# Patient Record
Sex: Male | Born: 1985 | Race: Black or African American | Hispanic: No | Marital: Single | Smoking: Current every day smoker
Health system: Southern US, Community
[De-identification: ages and names within clinical notes are randomized; demographics above are authoritative.]

## PROBLEM LIST (undated history)

## (undated) DIAGNOSIS — Z9109 Other allergy status, other than to drugs and biological substances: Secondary | ICD-10-CM

## (undated) DIAGNOSIS — J45909 Unspecified asthma, uncomplicated: Secondary | ICD-10-CM

---

## 2007-11-28 ENCOUNTER — Emergency Department: Payer: Self-pay | Admitting: Emergency Medicine

## 2007-11-28 ENCOUNTER — Other Ambulatory Visit: Payer: Self-pay

## 2009-04-29 IMAGING — CT CT CERVICAL SPINE WITHOUT CONTRAST
1 series · 12 of 14 positions shown, 15 images · non-contrast
Comparison: none

REASON FOR EXAM: neck pain
COMMENTS:

[Series 5: axial · axial · 0.30mm/px · z∈[+417,+578]mm · 12 of 97 slices shown, 15 images]
[im 8/97  soft-tissue]
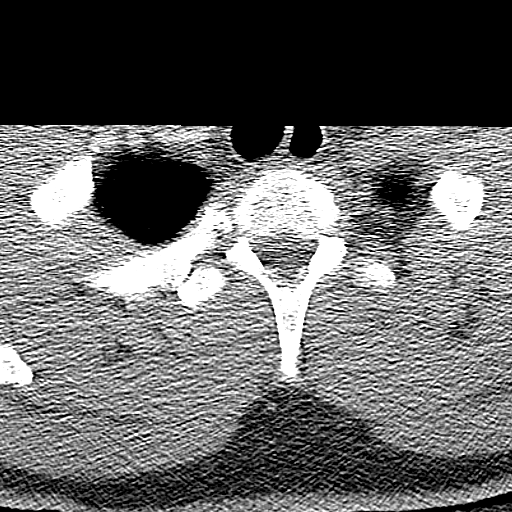
[im 8/97  bone]
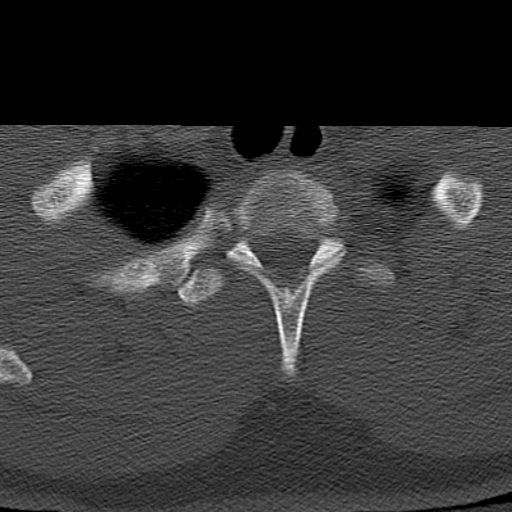
[im 15/97  bone]
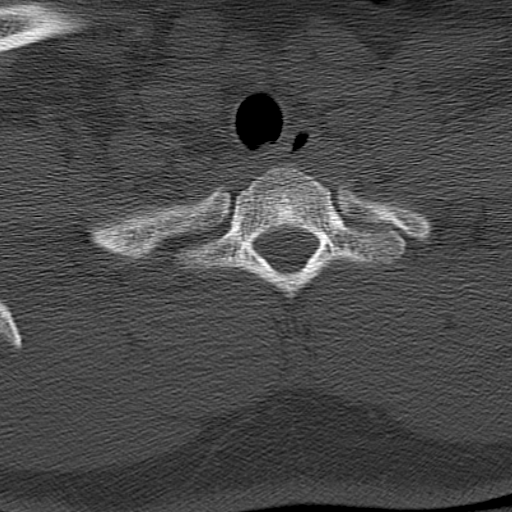
[im 23/97  bone]
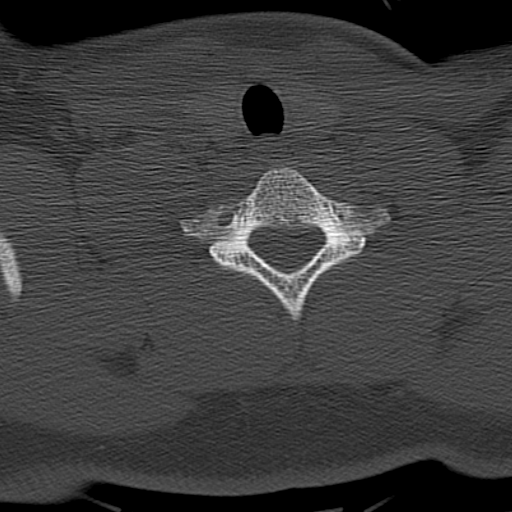
[im 30/97  bone]
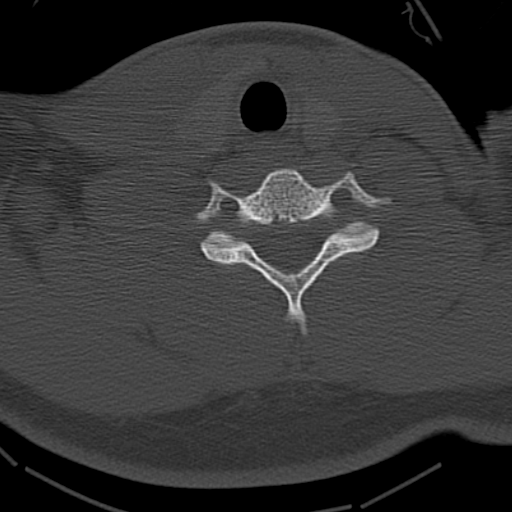
[im 37/97  soft-tissue]
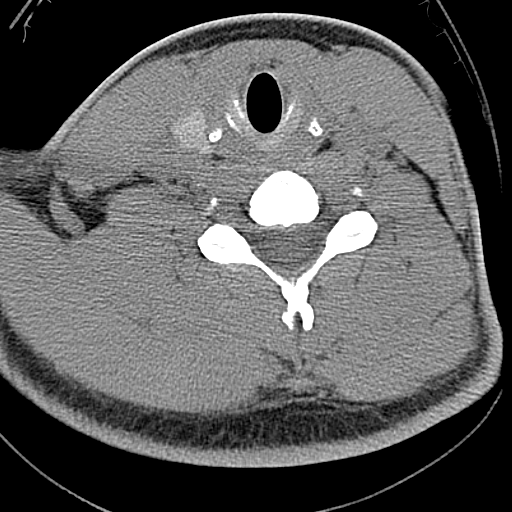
[im 37/97  bone]
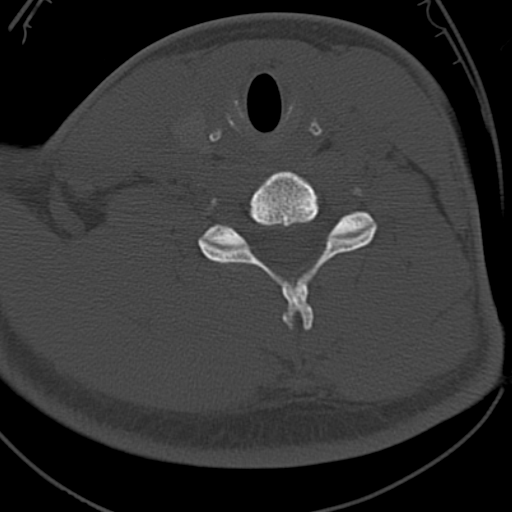
[im 45/97  bone]
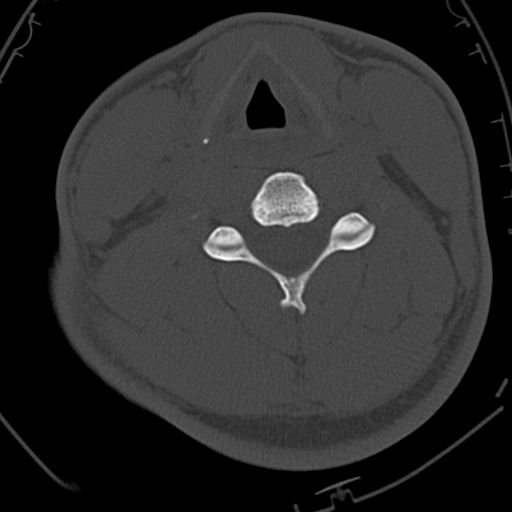
[im 52/97  bone]
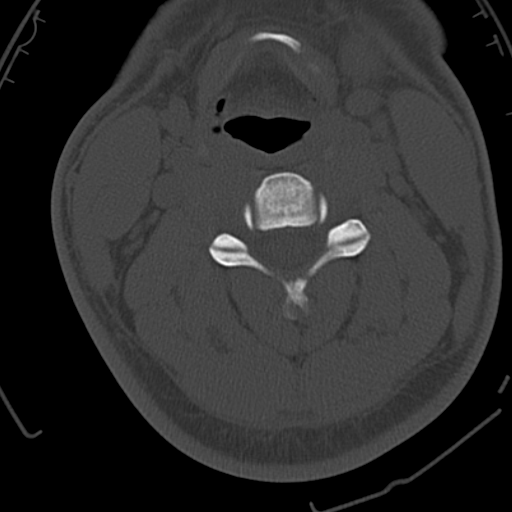
[im 60/97  bone]
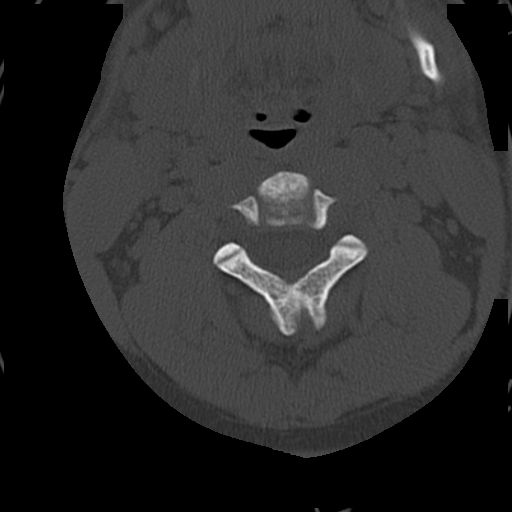
[im 67/97  soft-tissue]
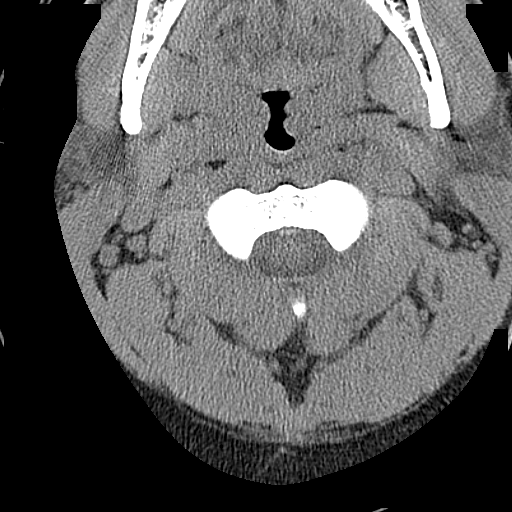
[im 67/97  bone]
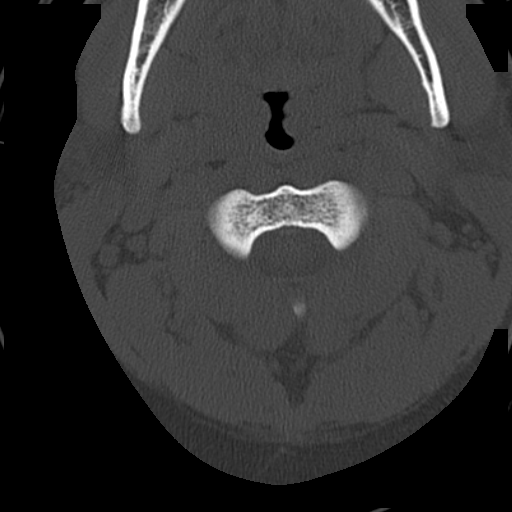
[im 74/97  bone]
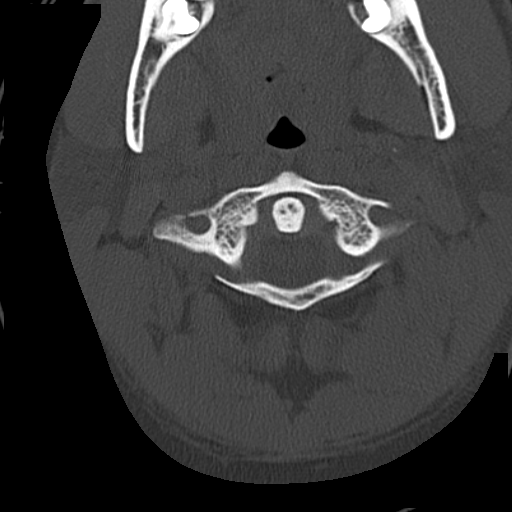
[im 82/97  bone]
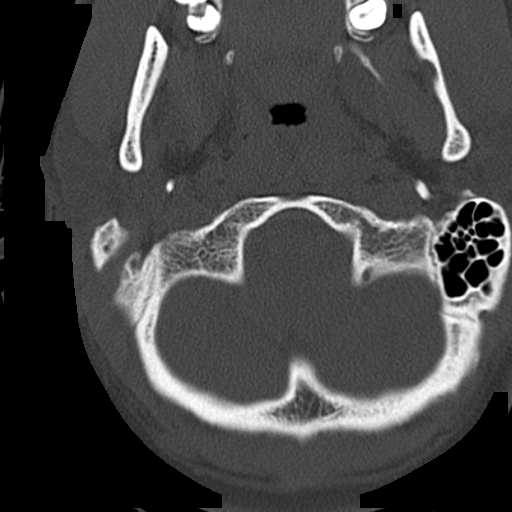
[im 89/97  bone]
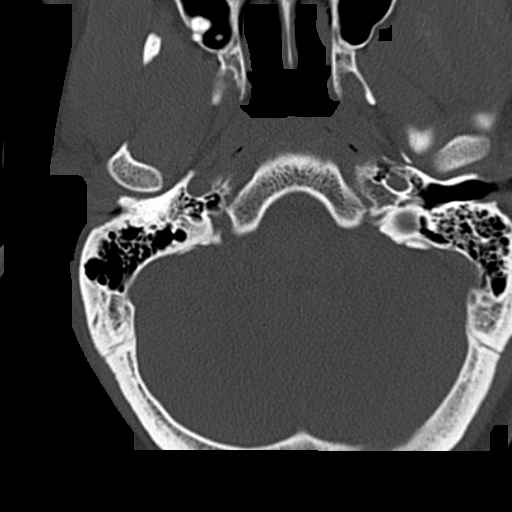

[12 of 14 positions shown; findings below may reference images not displayed]

PROCEDURE:     CT  - CT CERVICAL SPINE WO  - November 28, 2007  [DATE]

RESULT:     Multiplanar imaging of the cervical spine was obtained utilizing
a high definition bone algorithm.

There is no evidence of fracture, dislocation or malalignment. There is no
evidence of prevertebral soft tissue swelling. Scattered reactive lymph
nodes are identified within the neck.
IMPRESSION: 1.No evidence of acute osseous abnormalities.

2.Scattered reactive lymph nodes as described above.

Dr. Rahimbaks of the Emergency Room was informed of these findings via a
preliminary faxed report on 11/28/07 at [DATE] a.m., Central Time.

## 2013-02-13 ENCOUNTER — Emergency Department (HOSPITAL_COMMUNITY): Payer: BC Managed Care – PPO

## 2013-02-13 ENCOUNTER — Encounter (HOSPITAL_COMMUNITY): Payer: Self-pay | Admitting: Emergency Medicine

## 2013-02-13 ENCOUNTER — Emergency Department (HOSPITAL_COMMUNITY)
Admission: EM | Admit: 2013-02-13 | Discharge: 2013-02-13 | Disposition: A | Discharge status: Home / Self Care | Payer: BC Managed Care – PPO | Attending: Emergency Medicine | Admitting: Emergency Medicine

## 2013-02-13 DIAGNOSIS — J40 Bronchitis, not specified as acute or chronic: Secondary | ICD-10-CM | POA: Insufficient documentation

## 2013-02-13 DIAGNOSIS — R5381 Other malaise: Secondary | ICD-10-CM | POA: Insufficient documentation

## 2013-02-13 DIAGNOSIS — J3489 Other specified disorders of nose and nasal sinuses: Secondary | ICD-10-CM | POA: Insufficient documentation

## 2013-02-13 MED ORDER — IPRATROPIUM BROMIDE 0.02 % IN SOLN
0.5000 mg | Freq: Once | RESPIRATORY_TRACT | Status: AC
  Administered 2013-02-13: 0.5 mg via RESPIRATORY_TRACT
  Filled 2013-02-13: qty 2.5

## 2013-02-13 MED ORDER — ALBUTEROL SULFATE (5 MG/ML) 0.5% IN NEBU
5.0000 mg | INHALATION_SOLUTION | Freq: Once | RESPIRATORY_TRACT | Status: AC
  Administered 2013-02-13: 5 mg via RESPIRATORY_TRACT
  Filled 2013-02-13: qty 1

## 2013-02-13 MED ORDER — ALBUTEROL SULFATE HFA 108 (90 BASE) MCG/ACT IN AERS
2.0000 | INHALATION_SPRAY | RESPIRATORY_TRACT | Status: DC | PRN
  Administered 2013-02-13: 2 via RESPIRATORY_TRACT
  Filled 2013-02-13: qty 6.7

## 2013-02-13 MED ORDER — BENZONATATE 100 MG PO CAPS: 100.0000 mg | ORAL_CAPSULE | Freq: Three times a day (TID) | ORAL | Status: DC

## 2013-02-13 NOTE — ED Notes (Signed)
Cough for the past few days now, intermit green phlem when clearing his throat. No distress noted.

## 2013-02-13 NOTE — ED Provider Notes (Signed)
Medical screening examination/treatment/procedure(s) were performed by non-physician practitioner and as supervising physician I was immediately available for consultation/collaboration.  Priti Consoli F Skyy Nilan, MD 02/13/13 1755 

## 2013-02-13 NOTE — ED Provider Notes (Signed)
CSN: [id number]     Arrival date & time 02/13/13  1350 History  This chart was scribed for non-physician practitioner working with Shon Baton, MD by Ashley Jacobs, ED scribe. This patient was seen in room WTR9/WTR9 and the patient's care was started at 2:16 PM   First MD Initiated Contact with Patient 02/13/13 1407     Chief Complaint  Patient presents with  . Cough   (Consider location/radiation/quality/duration/timing/severity/associated sxs/prior Treatment) Patient is a 27 y.o. male presenting with cough. The history is provided by the patient and medical records. No language interpreter was used.  Cough Cough characteristics:  Productive Sputum characteristics:  Green Severity:  Moderate Onset quality:  Sudden Duration:  2 days Timing:  Constant Progression:  Unchanged Chronicity:  New Relieved by:  Nothing Worsened by:  Deep breathing Ineffective treatments:  None tried Associated symptoms: rhinorrhea and wheezing   Associated symptoms: no chest pain, no chills, no ear pain, no fever, no headaches, no myalgias, no rash and no sore throat    HPI Comments: Nakhi Choi is a 27 y.o. male who presents to the Emergency Department complaining of constant, productive cough for the past two days. He feels as though he was in a Forensic [employer] and lost a round". He explains that he is experiencing the associated symptoms of fatigue, wheezing, and an expiratory cough with green phlegm. He also mentions having mild congestion. Pt denies fever and headache. He has a hx of seasonal asthma.   History reviewed. No pertinent past medical history. History reviewed. No pertinent past surgical history. No family history on file. History  Substance Use Topics  . Smoking status: Not on file  . Smokeless tobacco: Not on file  . Alcohol Use: Not on file    Review of Systems  Constitutional: Positive for fatigue. Negative for fever and chills.  HENT: Positive for congestion and  rhinorrhea. Negative for ear pain, sore throat, neck stiffness and sinus pressure.   Eyes: Negative for redness.  Respiratory: Positive for cough and wheezing. Negative for chest tightness.   Cardiovascular: Negative for chest pain.  Gastrointestinal: Negative for nausea, vomiting, abdominal pain and diarrhea.  Genitourinary: Negative for dysuria.  Musculoskeletal: Negative for myalgias.  Skin: Negative for rash.  Neurological: Negative for headaches.  Hematological: Negative for adenopathy.  All other systems reviewed and are negative.    Allergies  Review of patient's allergies indicates no known allergies.  Home Medications   Current Outpatient Rx  Name  Route  Sig  Dispense  Refill  . benzonatate (TESSALON) 100 MG capsule   Oral   Take 1 capsule (100 mg total) by mouth every 8 (eight) hours.   15 capsule   0    BP 116/66  Pulse 66  Resp 18  SpO2 100% Physical Exam  Nursing note and vitals reviewed. Constitutional: He appears well-developed and well-nourished.  HENT:  Head: Normocephalic and atraumatic.  Right Ear: Tympanic membrane, external ear and ear canal normal.  Left Ear: Tympanic membrane, external ear and ear canal normal.  Nose: Nose normal. No mucosal edema or rhinorrhea.  Mouth/Throat: Uvula is midline, oropharynx is clear and moist and mucous membranes are normal. Mucous membranes are not dry. No trismus in the jaw. No edematous. No oropharyngeal exudate, posterior oropharyngeal edema, posterior oropharyngeal erythema or tonsillar abscesses.  Eyes: Conjunctivae are normal. Right eye exhibits no discharge. Left eye exhibits no discharge.  Neck: Normal range of motion. Neck supple.  Cardiovascular: Normal rate, regular rhythm and  normal heart sounds.   Pulmonary/Chest: Effort normal. No respiratory distress. He has wheezes (Mild, expiratory). He has no rales.  Abdominal: Soft. There is no tenderness.  Lymphadenopathy:    He has no cervical adenopathy.   Neurological: He is alert.  Skin: Skin is warm and dry.  Psychiatric: He has a normal mood and affect.    ED Course  Procedures (including critical care time) DIAGNOSTIC STUDIES: Oxygen Saturation is 100% on room air, normal by my interpretation.    COORDINATION OF CARE: 2:18 PM Discussed course of care with pt which includes Proventil and Atrovent. Pt understands and agrees.  Labs Review Labs Reviewed - No data to display Imaging Review Dg Chest 2 View  02/13/2013   CLINICAL DATA:  Cough and shortness of breath.  EXAM: CHEST  2 VIEW  COMPARISON:  None.  FINDINGS: The heart size and mediastinal contours are within normal limits. Both lungs are clear. The visualized skeletal structures are unremarkable.  IMPRESSION: Normal chest radiographs.   Electronically Signed   By: Amie Portland M.D.   On: 02/13/2013 14:46   Vital signs reviewed and are as follows: Filed Vitals:   02/13/13 1453  BP: 116/66  Pulse:   Resp:   BP 116/66  Pulse 66  Resp 18  SpO2 100%  Patient given breathing treatment in emergency department. On recheck, wheezing resolved. Patient informed of chest x-ray results.  Patient counseled on use of albuterol HFA. Told to use 1-2 puffs q 4 hours as needed for SOB.   MDM   1. Bronchitis    Patient with likely viral bronchitis. No concern for PNA given normal lung exam/x-ray. Antibiotics not indicated. Conservative therapy indicated.     I personally performed the services described in this documentation, which was scribed in my presence. The recorded information has been reviewed and is accurate.   Renne Crigler, PA-C 02/13/13 [phone]

## 2013-02-13 NOTE — Progress Notes (Signed)
[facility] provided pt with a list of primary care resources. Patient stated that he may have insurance through his father, but was unsure. Provided him with paperwork to send insurance information back to the hospital.

## 2013-08-09 ENCOUNTER — Emergency Department (HOSPITAL_COMMUNITY)
Admission: EM | Admit: 2013-08-09 | Discharge: 2013-08-09 | Disposition: A | Discharge status: Home / Self Care | Payer: BC Managed Care – PPO | Attending: Emergency Medicine | Admitting: Emergency Medicine

## 2013-08-09 ENCOUNTER — Encounter (HOSPITAL_COMMUNITY): Payer: Self-pay | Admitting: Emergency Medicine

## 2013-08-09 DIAGNOSIS — F17200 Nicotine dependence, unspecified, uncomplicated: Secondary | ICD-10-CM | POA: Insufficient documentation

## 2013-08-09 DIAGNOSIS — L509 Urticaria, unspecified: Secondary | ICD-10-CM

## 2013-08-09 DIAGNOSIS — J45909 Unspecified asthma, uncomplicated: Secondary | ICD-10-CM | POA: Insufficient documentation

## 2013-08-09 DIAGNOSIS — H5789 Other specified disorders of eye and adnexa: Secondary | ICD-10-CM | POA: Insufficient documentation

## 2013-08-09 DIAGNOSIS — R6889 Other general symptoms and signs: Secondary | ICD-10-CM | POA: Insufficient documentation

## 2013-08-09 HISTORY — DX: Unspecified asthma, uncomplicated: J45.909

## 2013-08-09 MED ORDER — DIPHENHYDRAMINE HCL 25 MG PO TABS: 25.0000 mg | ORAL_TABLET | Freq: Four times a day (QID) | ORAL | Status: AC | PRN

## 2013-08-09 MED ORDER — DIPHENHYDRAMINE HCL 25 MG PO CAPS
50.0000 mg | ORAL_CAPSULE | Freq: Once | ORAL | Status: AC
  Administered 2013-08-09: 50 mg via ORAL
  Filled 2013-08-09: qty 2

## 2013-08-09 NOTE — Progress Notes (Signed)
[facility] provided pt with a list of primary care resources to help patient establish primary care.  °

## 2013-08-09 NOTE — Discharge Instructions (Signed)
Allergies °Allergies may happen from anything your body is sensitive to. This may be food, medicines, pollens, chemicals, and nearly anything around you in everyday life that produces allergens. An allergen is anything that causes an allergy producing substance. Heredity is often a factor in causing these problems. This means you may have some of the same allergies as your parents. °Food allergies happen in all age groups. Food allergies are some of the most severe and life threatening. Some common food allergies are cow's milk, seafood, eggs, nuts, wheat, and soybeans. °SYMPTOMS  °· Swelling around the mouth. °· An itchy red rash or hives. °· Vomiting or diarrhea. °· Difficulty breathing. °SEVERE ALLERGIC REACTIONS ARE LIFE-THREATENING. °This reaction is called anaphylaxis. It can cause the mouth and throat to swell and cause difficulty with breathing and swallowing. In severe reactions only a trace amount of food (for example, peanut oil in a salad) may cause death within seconds. °Seasonal allergies occur in all age groups. These are seasonal because they usually occur during the same season every year. They may be a reaction to molds, grass pollens, or tree pollens. Other causes of problems are house dust mite allergens, pet dander, and mold spores. The symptoms often consist of nasal congestion, a runny itchy nose associated with sneezing, and tearing itchy eyes. There is often an associated itching of the mouth and ears. The problems happen when you come in contact with pollens and other allergens. Allergens are the particles in the air that the body reacts to with an allergic reaction. This causes you to release allergic antibodies. Through a chain of events, these eventually cause you to release histamine into the blood stream. Although it is meant to be protective to the body, it is this release that causes your discomfort. This is why you were given anti-histamines to feel better.  If you are unable to  pinpoint the offending allergen, it may be determined by skin or blood testing. Allergies cannot be cured but can be controlled with medicine. °Hay fever is a collection of all or some of the seasonal allergy problems. It may often be treated with simple over-the-counter medicine such as diphenhydramine. Take medicine as directed. Do not drink alcohol or drive while taking this medicine. Check with your caregiver or package insert for child dosages. °If these medicines are not effective, there are many new medicines your caregiver can prescribe. Stronger medicine such as nasal spray, eye drops, and corticosteroids may be used if the first things you try do not work well. Other treatments such as immunotherapy or desensitizing injections can be used if all else fails. Follow up with your caregiver if problems continue. These seasonal allergies are usually not life threatening. They are generally more of a nuisance that can often be handled using medicine. °HOME CARE INSTRUCTIONS  °· If unsure what causes a reaction, keep a diary of foods eaten and symptoms that follow. Avoid foods that cause reactions. °· If hives or rash are present: °· Take medicine as directed. °· You may use an over-the-counter antihistamine (diphenhydramine) for hives and itching as needed. °· Apply cold compresses (cloths) to the skin or take baths in cool water. Avoid hot baths or showers. Heat will make a rash and itching worse. °· If you are severely allergic: °· Following a treatment for a severe reaction, hospitalization is often required for closer follow-up. °· Wear a medic-alert bracelet or necklace stating the allergy. °· You and your family must learn how to give adrenaline or use   an anaphylaxis kit. °· If you have had a severe reaction, always carry your anaphylaxis kit or EpiPen® with you. Use this medicine as directed by your caregiver if a severe reaction is occurring. Failure to do so could have a fatal outcome. °SEEK MEDICAL  CARE IF: °· You suspect a food allergy. Symptoms generally happen within 30 minutes of eating a food. °· Your symptoms have not gone away within 2 days or are getting worse. °· You develop new symptoms. °· You want to retest yourself or your child with a food or drink you think causes an allergic reaction. Never do this if an anaphylactic reaction to that food or drink has happened before. Only do this under the care of a caregiver. °SEEK IMMEDIATE MEDICAL CARE IF:  °· You have difficulty breathing, are wheezing, or have a tight feeling in your chest or throat. °· You have a swollen mouth, or you have hives, swelling, or itching all over your body. °· You have had a severe reaction that has responded to your anaphylaxis kit or an EpiPen®. These reactions may return when the medicine has worn off. These reactions should be considered life threatening. °MAKE SURE YOU:  °· Understand these instructions. °· Will watch your condition. °· Will get help right away if you are not doing well or get worse. °Document Released: 07/20/2002 Document Revised: 08/21/2012 Document Reviewed: 12/25/2007 °[employer]® Patient Information ©2014 [employer]. ° °

## 2013-08-09 NOTE — ED Notes (Addendum)
Pt reports intermittent hives, itching, area around eyes feels swollen. Sx unresolved by Claritin. Denies shortness of breath or difficulty swallowing

## 2013-08-09 NOTE — ED Provider Notes (Signed)
CSN: [id number]     Arrival date & time 08/09/13  1143 History  This chart was scribed for non-physician practitioner Roxy Horseman, PA-C, working with Juliet Rude. Rubin Payor, MD, by Yevette Edwards, ED Scribe. This patient was seen in room WTR5/WTR5 and the patient's care was started at 12:22 PM. First MD Initiated Contact with Patient 08/09/13 1207     Chief Complaint  Patient presents with  . Urticaria    itching and swelling due to seasonal allergies    The history is provided by the patient. No language interpreter was used.   HPI Comments: Corey Burke is a 28 y.o. male, with a h/o asthma, presenting to the Emergency Department with the chief complaint of hives which ahve been occurring intermittently for thee days. He states itching and pain associated with the hives as well as sneezing and eye irritation, and he reports the hives are increased in the morning and night. Additionally, he states the hives are increased with scratching. The pt has used Claritin, eye drops, and calamine lotion to treat the hives; he began using Claritin when the symptoms presented. He had similar symptoms last year; however, he reports last symptom's were better controlled. He had chronic asthma as a child which has resolved; he was informed he currently he has seasonal asthma.   Past Medical History  Diagnosis Date  . Asthma    History reviewed. No pertinent past surgical history. History reviewed. No pertinent family history. History  Substance Use Topics  . Smoking status: Current Every Day Smoker    Types: Cigarettes  . Smokeless tobacco: Not on file  . Alcohol Use: Yes    Review of Systems  A complete 10 system review of systems was obtained, and all systems were negative except where indicated in the HPI and PE.    Allergies  Review of patient's allergies indicates no known allergies.  Home Medications   Current Outpatient Rx  Name  Route  Sig  Dispense  Refill  . benzonatate  (TESSALON) 100 MG capsule   Oral   Take 1 capsule (100 mg total) by mouth every 8 (eight) hours.   15 capsule   0    Triage Vitals: BP 124/85  Pulse 88  Temp(Src) 98 F (36.7 C) (Oral)  Resp 18  Wt 230 lb (104.327 kg)  SpO2 96%  Physical Exam  Nursing note and vitals reviewed. Constitutional: He is oriented to person, place, and time. He appears well-developed and well-nourished. No distress.  HENT:  Head: Normocephalic and atraumatic.  Eyes: EOM are normal.  Neck: Neck supple. No tracheal deviation present.  Cardiovascular: Normal rate.   Pulmonary/Chest: Effort normal. No respiratory distress. He has no wheezes.  Musculoskeletal: Normal range of motion.  Neurological: He is alert and oriented to person, place, and time.  Skin: Skin is warm and dry. No rash noted. No erythema.  No obvious rash to skin.   Psychiatric: He has a normal mood and affect. His behavior is normal.    ED Course  Procedures (including critical care time)  DIAGNOSTIC STUDIES: Oxygen Saturation is 96% on room air, normal by my interpretation.    COORDINATION OF CARE:  12:24 PM- Discussed treatment plan with patient, and the patient agreed to the plan. The plan includes continual usage of Claritin as well as Benadryl.   Labs Review Labs Reviewed - No data to display Imaging Review No results found.   EKG Interpretation None      MDM   Final  diagnoses:  Urticaria   Patient with seasonal allergies. No rash currently, no fever, no wheezing.   I personally performed the services described in this documentation, which was scribed in my presence. The recorded information has been reviewed and is accurate.       Roxy Horsemanobert Doralyn Kirkes, PA-C 08/09/13 1239

## 2013-08-11 NOTE — ED Provider Notes (Signed)
Medical screening examination/treatment/procedure(s) were performed by non-physician practitioner and as supervising physician I was immediately available for consultation/collaboration.   EKG Interpretation None       Juliet RudeNathan R. Rubin PayorPickering, MD 08/11/13 1513

## 2013-08-15 ENCOUNTER — Encounter (HOSPITAL_COMMUNITY): Payer: Self-pay | Admitting: Emergency Medicine

## 2013-08-15 ENCOUNTER — Emergency Department (HOSPITAL_COMMUNITY)
Admission: EM | Admit: 2013-08-15 | Discharge: 2013-08-15 | Disposition: A | Discharge status: Home / Self Care | Payer: BC Managed Care – PPO | Attending: Emergency Medicine | Admitting: Emergency Medicine

## 2013-08-15 DIAGNOSIS — T4995XA Adverse effect of unspecified topical agent, initial encounter: Secondary | ICD-10-CM | POA: Insufficient documentation

## 2013-08-15 DIAGNOSIS — F17200 Nicotine dependence, unspecified, uncomplicated: Secondary | ICD-10-CM | POA: Insufficient documentation

## 2013-08-15 DIAGNOSIS — T7840XA Allergy, unspecified, initial encounter: Secondary | ICD-10-CM

## 2013-08-15 DIAGNOSIS — J45901 Unspecified asthma with (acute) exacerbation: Secondary | ICD-10-CM | POA: Insufficient documentation

## 2013-08-15 DIAGNOSIS — R21 Rash and other nonspecific skin eruption: Secondary | ICD-10-CM | POA: Insufficient documentation

## 2013-08-15 DIAGNOSIS — L509 Urticaria, unspecified: Secondary | ICD-10-CM

## 2013-08-15 DIAGNOSIS — Z79899 Other long term (current) drug therapy: Secondary | ICD-10-CM | POA: Insufficient documentation

## 2013-08-15 DIAGNOSIS — L299 Pruritus, unspecified: Secondary | ICD-10-CM | POA: Insufficient documentation

## 2013-08-15 HISTORY — DX: Other allergy status, other than to drugs and biological substances: Z91.09

## 2013-08-15 MED ORDER — ALBUTEROL SULFATE HFA 108 (90 BASE) MCG/ACT IN AERS
2.0000 | INHALATION_SPRAY | Freq: Once | RESPIRATORY_TRACT | Status: AC
  Administered 2013-08-15: 2 via RESPIRATORY_TRACT
  Filled 2013-08-15: qty 6.7

## 2013-08-15 MED ORDER — PREDNISONE 20 MG PO TABS: 60.0000 mg | ORAL_TABLET | Freq: Every day | ORAL | Status: AC

## 2013-08-15 MED ORDER — PREDNISONE 20 MG PO TABS
60.0000 mg | ORAL_TABLET | Freq: Once | ORAL | Status: AC
  Administered 2013-08-15: 60 mg via ORAL
  Filled 2013-08-15: qty 3

## 2013-08-15 NOTE — ED Provider Notes (Signed)
CSN: [id number]     Arrival date & time 08/15/13  0804 History   First MD Initiated Contact with Patient 08/15/13 0813     Chief Complaint  Patient presents with  . Allergies     (Consider location/radiation/quality/duration/timing/severity/associated sxs/prior Treatment) HPI 28 year old male presents with hives for several days. He was seen here a few days ago and given Benadryl. He's been taking Benadryl 50 mg every 6 hours with minimal relief. States the hives but fluctuating in intensity. They're itchy and diffuse. He's also been having rhinorrhea and watery, itchy eyes. He's tried some over-the-counter eyedrops with some relief. He states he feels like his childhood asthma is also flaring. Last night he had trouble breathing with wheezing. Today it is not as bad. Denies any lip swelling, trouble speaking, drooling, or trouble swallowing. Denies a chest pain. He has not taken any new medicines or food. He states that as a kid he had severe allergies when he lived in the [place] and was diagnosed with severe pollen allergies by allergy skin testing. He currently lives in [place] but has been in [place] for last couple weeks. He is going back to [place] in about 5 days. He never has these symptoms when he is in [place]. He currently is down here for work, and works outside.  Past Medical History  Diagnosis Date  . Asthma   . Pollen allergies    History reviewed. No pertinent past surgical history. History reviewed. No pertinent family history. History  Substance Use Topics  . Smoking status: Current Every Day Smoker    Types: Cigarettes  . Smokeless tobacco: Not on file  . Alcohol Use: Yes    Review of Systems  Constitutional: Negative for fever.  HENT: Positive for congestion and rhinorrhea. Negative for sore throat, trouble swallowing and voice change.   Eyes: Positive for itching.  Respiratory: Positive for shortness of breath and wheezing.   Gastrointestinal: Negative  for vomiting.  Skin: Positive for rash.  All other systems reviewed and are negative.     Allergies  Review of patient's allergies indicates no known allergies.  Home Medications   Current Outpatient Rx  Name  Route  Sig  Dispense  Refill  . diphenhydrAMINE (BENADRYL) 25 MG tablet   Oral   Take 1 tablet (25 mg total) by mouth every 6 (six) hours as needed for itching (Rash).   30 tablet   0   . loratadine (CLARITIN) 10 MG tablet   Oral   Take 10 mg by mouth daily.          BP 129/86  Pulse 94  Temp(Src) 98.2 F (36.8 C) (Oral)  Resp 16  SpO2 98% Physical Exam  Nursing note and vitals reviewed. Constitutional: He is oriented to person, place, and time. He appears well-developed and well-nourished.  HENT:  Head: Normocephalic and atraumatic.  Right Ear: External ear normal.  Left Ear: External ear normal.  Nose: Nose normal.  Mouth/Throat: Uvula is midline, oropharynx is clear and moist and mucous membranes are normal. No uvula swelling. No oropharyngeal exudate, posterior oropharyngeal edema or posterior oropharyngeal erythema.  Eyes: Right eye exhibits no chemosis, no discharge and no exudate. Left eye exhibits no chemosis, no discharge and no exudate. Right conjunctiva is not injected. Right conjunctiva has no hemorrhage. Left conjunctiva is not injected. Left conjunctiva has no hemorrhage.  Neck: Neck supple.  Cardiovascular: Normal rate, regular rhythm, normal heart sounds and intact distal pulses.   Pulmonary/Chest:  Effort normal. No accessory muscle usage. Not tachypneic. He has no decreased breath sounds. He has wheezes (scant, intermittent expiratory wheezes).  Abdominal: Soft. There is no tenderness.  Musculoskeletal: He exhibits no edema.  Neurological: He is alert and oriented to person, place, and time.  Skin: Skin is warm and dry. Rash (diffuse hives in all 4 extremities, chest/abdomen and back) noted. Rash is urticarial.  No signs of pus or infection     ED Course  Procedures (including critical care time) Labs Review Labs Reviewed - No data to display Imaging Review No results found.   EKG Interpretation None      MDM   Final diagnoses:  Hives  Allergic reaction    Patient with scant wheezes now, no respiratory distress. No trouble swallowing, speaking, or signs of acute swelling to oropharynx. Has diffuse hives, as above. Will continue claritin and benadryl, will give burst of prednisone given his diffuse symptoms and albuterol prn bronchospasm. It seems his allergies are from pollen, no new foods or medicines that could be causing his hives. Discussed other OTC, symptomatic care. VSS here, will discharge home with return precautions. Recommended he try to work inside if possible, as avoidance is best strategy.    Audree CamelScott T Corvette Orser, MD 08/15/13 [phone]

## 2013-08-15 NOTE — ED Notes (Signed)
MD at bedside. 

## 2013-08-15 NOTE — ED Notes (Signed)
Pt took OTC meds as directed by last visit here with no relief

## 2013-08-15 NOTE — ED Notes (Signed)
Pt states that he is allergic to pollen.  Has been taking claritin and benadryl.  Has still been breaking out in hives.  States that he thinks his "asthma is coming back".  Able to speak in complete sentences.  Also c/o eye irritation.

## 2013-08-15 NOTE — Discharge Instructions (Signed)
Use the albuterol inhaler if you have breathing difficulty or wheezing. Use 2 puffs every four hours as needed. If your breathing or other symptoms worsen, return to this or any other ER for evaluation. The best way to control your allergy symptoms is avoidance of the allergen (pollen).  Allergies Allergies may happen from anything your body is sensitive to. This may be food, medicines, pollens, chemicals, and nearly anything around you in everyday life that produces allergens. An allergen is anything that causes an allergy producing substance. Heredity is often a factor in causing these problems. This means you may have some of the same allergies as your parents. Food allergies happen in all age groups. Food allergies are some of the most severe and life threatening. Some common food allergies are cow's milk, seafood, eggs, nuts, wheat, and soybeans. SYMPTOMS   Swelling around the mouth.  An itchy red rash or hives.  Vomiting or diarrhea.  Difficulty breathing. SEVERE ALLERGIC REACTIONS ARE LIFE-THREATENING. This reaction is called anaphylaxis. It can cause the mouth and throat to swell and cause difficulty with breathing and swallowing. In severe reactions only a trace amount of food (for example, peanut oil in a salad) may cause death within seconds. Seasonal allergies occur in all age groups. These are seasonal because they usually occur during the same season every year. They may be a reaction to molds, grass pollens, or tree pollens. Other causes of problems are house dust mite allergens, pet dander, and mold spores. The symptoms often consist of nasal congestion, a runny itchy nose associated with sneezing, and tearing itchy eyes. There is often an associated itching of the mouth and ears. The problems happen when you come in contact with pollens and other allergens. Allergens are the particles in the air that the body reacts to with an allergic reaction. This causes you to release allergic  antibodies. Through a chain of events, these eventually cause you to release histamine into the blood stream. Although it is meant to be protective to the body, it is this release that causes your discomfort. This is why you were given anti-histamines to feel better. If you are unable to pinpoint the offending allergen, it may be determined by skin or blood testing. Allergies cannot be cured but can be controlled with medicine. Hay fever is a collection of all or some of the seasonal allergy problems. It may often be treated with simple over-the-counter medicine such as diphenhydramine. Take medicine as directed. Do not drink alcohol or drive while taking this medicine. Check with your caregiver or package insert for child dosages. If these medicines are not effective, there are many new medicines your caregiver can prescribe. Stronger medicine such as nasal spray, eye drops, and corticosteroids may be used if the first things you try do not work well. Other treatments such as immunotherapy or desensitizing injections can be used if all else fails. Follow up with your caregiver if problems continue. These seasonal allergies are usually not life threatening. They are generally more of a nuisance that can often be handled using medicine. HOME CARE INSTRUCTIONS   If unsure what causes a reaction, keep a diary of foods eaten and symptoms that follow. Avoid foods that cause reactions.  If hives or rash are present:  Take medicine as directed.  You may use an over-the-counter antihistamine (diphenhydramine) for hives and itching as needed.  Apply cold compresses (cloths) to the skin or take baths in cool water. Avoid hot baths or showers. Heat will  make a rash and itching worse.  If you are severely allergic:  Following a treatment for a severe reaction, hospitalization is often required for closer follow-up.  Wear a medic-alert bracelet or necklace stating the allergy.  You and your family must  learn how to give adrenaline or use an anaphylaxis kit.  If you have had a severe reaction, always carry your anaphylaxis kit or EpiPen with you. Use this medicine as directed by your caregiver if a severe reaction is occurring. Failure to do so could have a fatal outcome. SEEK MEDICAL CARE IF:  You suspect a food allergy. Symptoms generally happen within 30 minutes of eating a food.  Your symptoms have not gone away within 2 days or are getting worse.  You develop new symptoms.  You want to retest yourself or your child with a food or drink you think causes an allergic reaction. Never do this if an anaphylactic reaction to that food or drink has happened before. Only do this under the care of a caregiver. SEEK IMMEDIATE MEDICAL CARE IF:   You have difficulty breathing, are wheezing, or have a tight feeling in your chest or throat.  You have a swollen mouth, or you have hives, swelling, or itching all over your body.  You have had a severe reaction that has responded to your anaphylaxis kit or an EpiPen. These reactions may return when the medicine has worn off. These reactions should be considered life threatening. MAKE SURE YOU:   Understand these instructions.  Will watch your condition.  Will get help right away if you are not doing well or get worse. Document Released: 07/20/2002 Document Revised: 08/21/2012 Document Reviewed: 12/25/2007 [facility] Patient Information 2014 [place].

## 2013-08-15 NOTE — Progress Notes (Signed)
Albuterol inhaler given with spacer. Pt knows and understands how to use.

## 2014-07-16 IMAGING — CR DG CHEST 2V
2 series · 2 of 2 positions shown · non-contrast
Comparison: None.

CLINICAL DATA: Cough and shortness of breath.

EXAM:
CHEST  2 VIEW

[w chest pa]
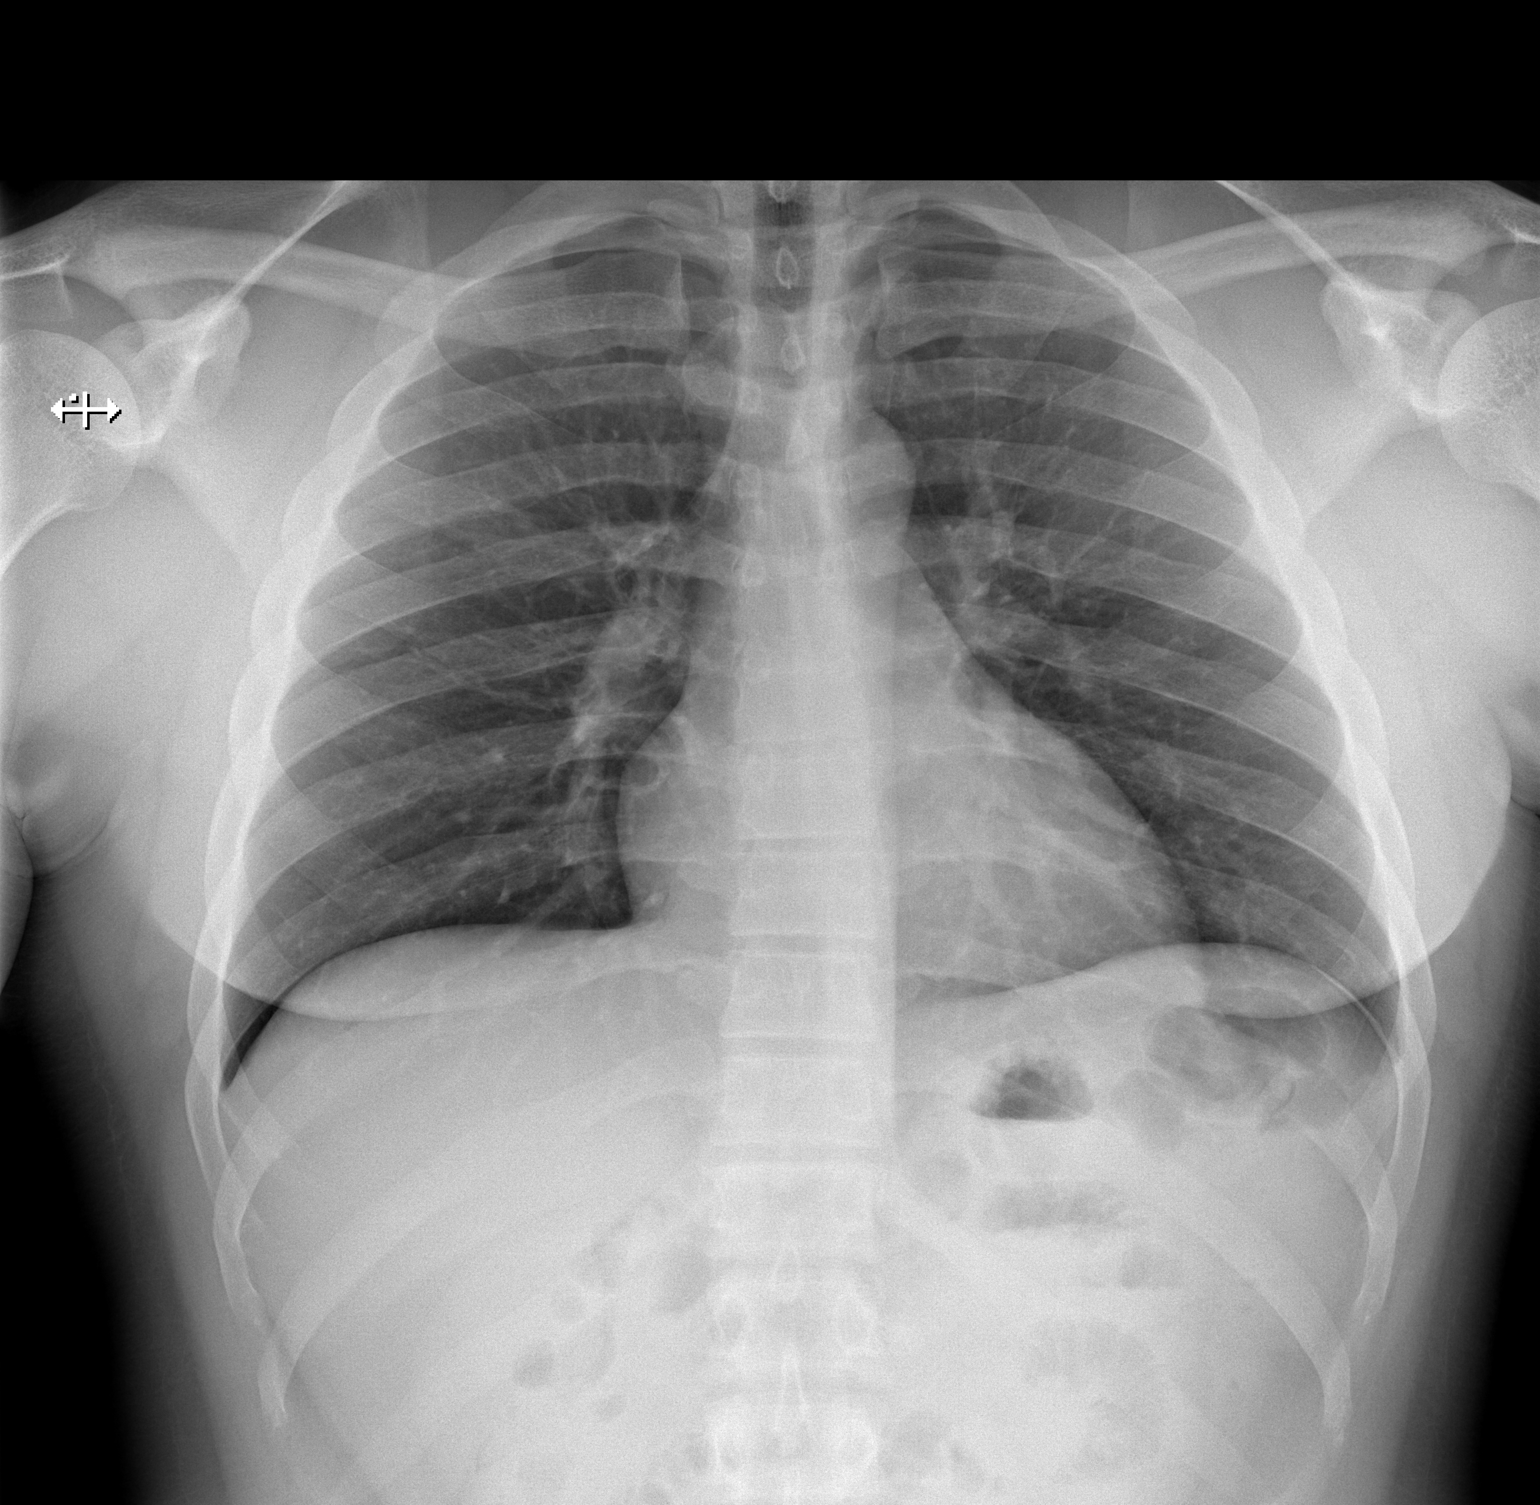

[w chest lat]
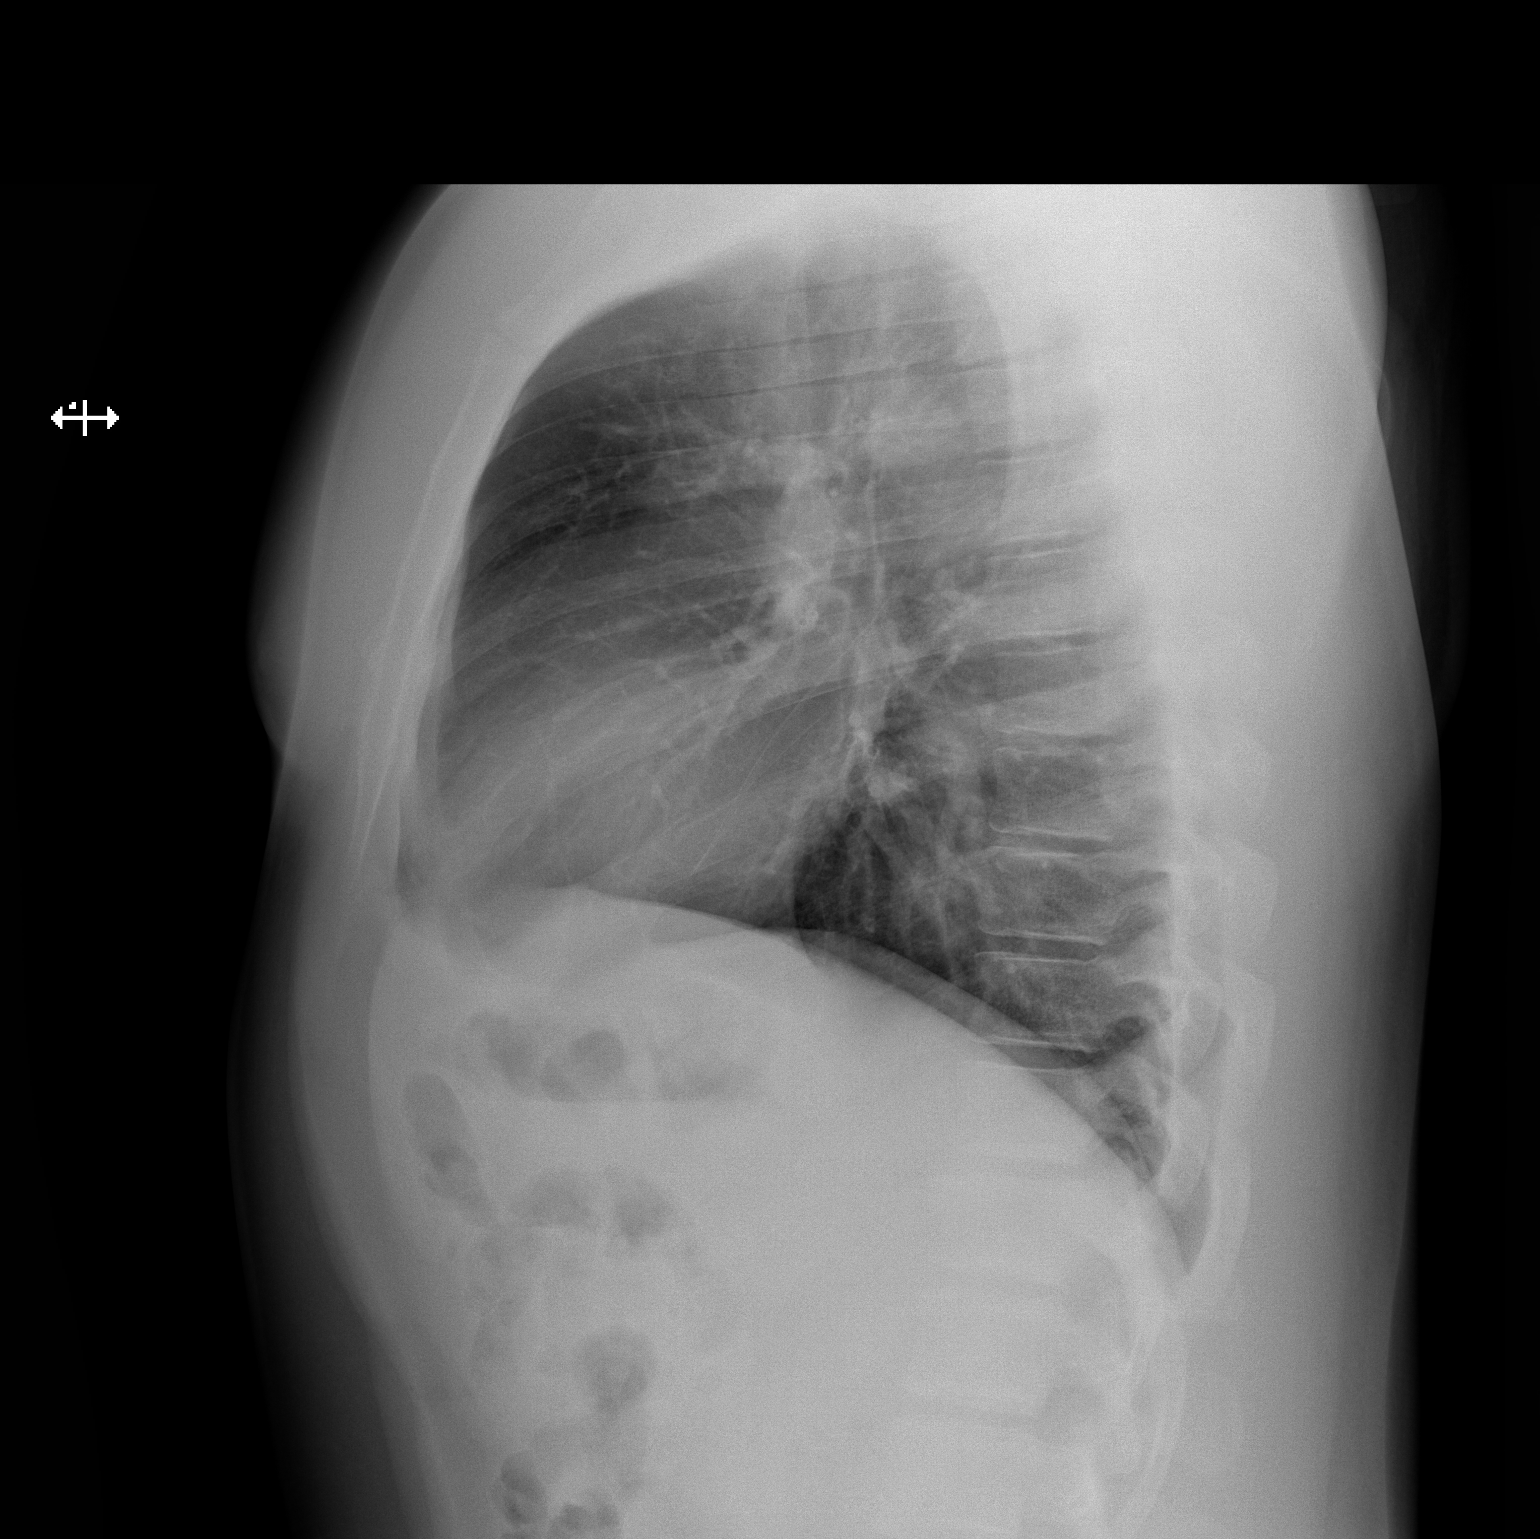

[2 of 2 positions shown; findings below may reference images not displayed]

FINDINGS: The heart size and mediastinal contours are within normal limits.
Both lungs are clear. The visualized skeletal structures are
unremarkable.
IMPRESSION: Normal chest radiographs.
